# Patient Record
Sex: Male | Born: 1980 | Smoking: Current every day smoker
Health system: Southern US, Community
[De-identification: ages and names within clinical notes are randomized; demographics above are authoritative.]

---

## 2015-04-15 ENCOUNTER — Emergency Department (HOSPITAL_COMMUNITY)
Admission: EM | Admit: 2015-04-15 | Discharge: 2015-04-15 | Disposition: A | Discharge status: Home / Self Care | Payer: Self-pay | Attending: Emergency Medicine | Admitting: Emergency Medicine

## 2015-04-15 ENCOUNTER — Encounter (HOSPITAL_COMMUNITY): Payer: Self-pay | Admitting: Emergency Medicine

## 2015-04-15 ENCOUNTER — Emergency Department (HOSPITAL_COMMUNITY): Payer: Self-pay

## 2015-04-15 DIAGNOSIS — Y9389 Activity, other specified: Secondary | ICD-10-CM | POA: Insufficient documentation

## 2015-04-15 DIAGNOSIS — Y998 Other external cause status: Secondary | ICD-10-CM | POA: Insufficient documentation

## 2015-04-15 DIAGNOSIS — Z720 Tobacco use: Secondary | ICD-10-CM | POA: Insufficient documentation

## 2015-04-15 DIAGNOSIS — S139XXA Sprain of joints and ligaments of unspecified parts of neck, initial encounter: Secondary | ICD-10-CM

## 2015-04-15 DIAGNOSIS — S134XXA Sprain of ligaments of cervical spine, initial encounter: Secondary | ICD-10-CM | POA: Insufficient documentation

## 2015-04-15 DIAGNOSIS — Y92410 Unspecified street and highway as the place of occurrence of the external cause: Secondary | ICD-10-CM | POA: Insufficient documentation
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
## 2015-04-15 NOTE — Discharge Instructions (Signed)
Cervical Sprain A cervical sprain is when the tissues (ligaments) that hold the neck bones in place stretch or tear. HOME CARE   Put ice on the injured area.  Put ice in a plastic bag.  Place a towel between your skin and the bag.  Leave the ice on for 15-20 minutes, 3-4 times a day.  You may have been given a collar to wear. This collar keeps your neck from moving while you heal.  Do not take the collar off unless told by your doctor.  If you have long hair, keep it outside of the collar.  Ask your doctor before changing the position of your collar. You may need to change its position over time to make it more comfortable.  If you are allowed to take off the collar for cleaning or bathing, follow your doctor's instructions on how to do it safely.  Keep your collar clean by wiping it with mild soap and water. Dry it completely. If the collar has removable pads, remove them every 1-2 days to hand wash them with soap and water. Allow them to air dry. They should be dry before you wear them in the collar.  Do not drive while wearing the collar.  Only take medicine as told by your doctor.  Keep all doctor visits as told.  Keep all physical therapy visits as told.  Adjust your work station so that you have good posture while you work.  Avoid positions and activities that make your problems worse.  Warm up and stretch before being active. GET HELP IF:  Your pain is not controlled with medicine.  You cannot take less pain medicine over time as planned.  Your activity level does not improve as expected. GET HELP RIGHT AWAY IF:   You are bleeding.  Your stomach is upset.  You have an allergic reaction to your medicine.  You develop new problems that you cannot explain.  You lose feeling (become numb) or you cannot move any part of your body (paralysis).  You have tingling or weakness in any part of your body.  Your symptoms get worse. Symptoms include:  Pain,  soreness, stiffness, puffiness (swelling), or a burning feeling in your neck.  Pain when your neck is touched.  Shoulder or upper back pain.  Limited ability to move your neck.  Headache.  Dizziness.  Your hands or arms feel week, lose feeling, or tingle.  Muscle spasms.  Difficulty swallowing or chewing. MAKE SURE YOU:   Understand these instructions.  Will watch your condition.  Will get help right away if you are not doing well or get worse. Document Released: 04/25/2008 Document Revised: 07/10/2013 Document Reviewed: 05/15/2013 [employer] Patient Information 2015 [employer]. This information is not intended to replace advice given to you by your health care provider. Make sure you discuss any questions you have with your health care provider.  
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
